# Patient Record
Sex: Male | Born: 1965 | Race: White | Hispanic: No | Marital: Married | Smoking: Never smoker
Health system: Southern US, Community
[De-identification: ages and names within clinical notes are randomized; demographics above are authoritative.]

---

## 2015-02-12 DIAGNOSIS — R6882 Decreased libido: Secondary | ICD-10-CM | POA: Insufficient documentation

## 2015-02-12 DIAGNOSIS — E785 Hyperlipidemia, unspecified: Secondary | ICD-10-CM | POA: Insufficient documentation

## 2015-02-12 DIAGNOSIS — G2581 Restless legs syndrome: Secondary | ICD-10-CM | POA: Insufficient documentation

## 2015-02-12 DIAGNOSIS — I10 Essential (primary) hypertension: Secondary | ICD-10-CM | POA: Insufficient documentation

## 2015-02-12 DIAGNOSIS — F909 Attention-deficit hyperactivity disorder, unspecified type: Secondary | ICD-10-CM | POA: Insufficient documentation

## 2015-02-12 DIAGNOSIS — F419 Anxiety disorder, unspecified: Secondary | ICD-10-CM | POA: Insufficient documentation

## 2015-02-12 DIAGNOSIS — K760 Fatty (change of) liver, not elsewhere classified: Secondary | ICD-10-CM | POA: Insufficient documentation

## 2015-02-16 DIAGNOSIS — Z0000 Encounter for general adult medical examination without abnormal findings: Secondary | ICD-10-CM | POA: Insufficient documentation

## 2015-09-01 NOTE — Progress Notes (Signed)
[facility] [address] [place], [place] [address] Phone: [phone] Subjective:     CC: back pain, groin pain  BJY:NWGNFAOZHY  Michael Groft. is a 50 y.o. male coming in with complaint of back pain and groin pain. Patient is an active 50 year old male. Patient states that he was helping a friend move heavy materials and unfortunately had a sharp pain that occurred in the back. Larey Seat radiation going down the leg. Very similar to an episode where he had an L4 bulging disc when he was 50 years of age. Patient states that this is approximately 3 weeks ago. Seems to be worsening over the course of time. Patient denies any weakness but states that he does have radiation going down the posterior aspect of the leg all the way down to the foot. Patient denies any weakness. States though that he has not worked out for about 3 weeks secondary to the pain. Patient is still able to do daily activities but seems that when he sits for long amount of time or stands for long amount time and seems to be worse. Worse with flexion that it is with extension. States that when he does stretch it seems to be better.     Current Outpatient Prescriptions:  .  fenofibrate 160 MG tablet, TAKE 1 TABLET BY MOUTH DAILY, Disp: , Rfl:  .  FLUoxetine (PROZAC) 10 MG capsule, TAKE 3 CAPSULES EVERY DAY, Disp: , Rfl:  .  lisinopril-hydrochlorothiazide (PRINZIDE,ZESTORETIC) 10-12.5 MG tablet, TAKE 1 TABLET BY MOUTH EVERY DAY, Disp: , Rfl:  .  gabapentin (NEURONTIN) 100 MG capsule, Take 2 capsules (200 mg total) by mouth at bedtime., Disp: 60 capsule, Rfl: 3 .  predniSONE (DELTASONE) 50 MG tablet, Take 1 tablet (50 mg total) by mouth daily., Disp: 5 tablet, Rfl: 0  No past medical history on file.hyperlipidemia,hypertension No past surgical history on file. Social History   Social History  . Marital status: Married    Spouse name: N/A  . Number of children: N/A  . Years of education: N/A    Social History Main Topics  . Smoking status: Never Smoker  . Smokeless tobacco: Never Used  . Alcohol use None  . Drug use: Unknown  . Sexual activity: Not Asked   Other Topics Concern  . None   Social History Narrative  . None   Allergies  Allergen Reactions  . Niacin Other (See Comments)    Sensation   No family history on file.no family history of rheumatological diseases. Patient's mother has had significant osteoarthritic difficulties  Past medical history, social, surgical and family history all reviewed in electronic medical record.  No pertanent information unless stated regarding to the chief complaint.   Review of Systems: No headache, visual changes, nausea, vomiting, diarrhea, constipation, dizziness, abdominal pain, skin rash, fevers, chills, night sweats, weight loss, swollen lymph nodes, body aches, joint swelling, muscle aches, chest pain, shortness of breath, mood changes.   Objective  Blood pressure 124/82, pulse 81, height  (1.88 m), weight 265 lb (120.2 kg), SpO2 96 %.  General: No apparent distress alert and oriented x3 mood and affect normal, dressed appropriately.  HEENT: Pupils equal, extraocular movements intact  Respiratory: Patient's speak in full sentences and does not appear short of breath  Cardiovascular: No lower extremity edema, non tender, no erythema  Skin: Warm dry intact with no signs of infection or rash on extremities or on axial skeleton.  Abdomen: Soft nontender  Neuro:  Cranial nerves II through XII are intact, neurovascularly intact in all extremities with 2+ DTRs and 2+ pulses.  Lymph: No lymphadenopathy of posterior or anterior cervical chain or axillae bilaterally.  Gait normal with good balance and coordination.  MSK:  Non tender with full range of motion and good stability and symmetric strength and tone of shoulders, elbows, wrist,  knee and ankles bilaterally.  Back Exam:  Inspection: Unremarkable  Motion: Flexion 35 deg  with worsening pain mild radiation down the right leg, Extension 15 deg, Side Bending to 45 deg bilaterally,  Rotation to 45 deg bilaterally  SLR laying:mild positive right side XSLR laying: Negative  Palpable tenderness: tender to palpation in the paraspinal musculature of the lumbar spine bilaterally right greater than left. FABER: significant tightness bilaterally Sensory change: Gross sensation intact to all lumbar and sacral dermatomes.  Reflexes: 2+ at both patellar tendons, 2+ at achilles tendons, Babinski's downgoing.  Strength at foot  Plantar-flexion: 5/5 Dorsi-flexion: 5/5 Eversion: 5/5 Inversion: 5/5  Leg strength  Quad: 5/5 Hamstring: 5/5 Hip flexor: 5/5 Hip abductors: 5/5  Gait unremarkable.  Hip has full internal range of motion bilaterally   Impression and Recommendations:     This case required medical decision making of moderate complexity.      Note: This dictation was prepared with Dragon dictation along with smaller phrase technology. Any transcriptional errors that result from this process are unintentional.

## 2015-09-02 ENCOUNTER — Ambulatory Visit (INDEPENDENT_AMBULATORY_CARE_PROVIDER_SITE_OTHER)
Admission: RE | Admit: 2015-09-02 | Discharge: 2015-09-02 | Disposition: A | Discharge status: Home / Self Care | Payer: BLUE CROSS/BLUE SHIELD | Source: Ambulatory Visit | Attending: Family Medicine | Admitting: Family Medicine

## 2015-09-02 ENCOUNTER — Ambulatory Visit (INDEPENDENT_AMBULATORY_CARE_PROVIDER_SITE_OTHER): Payer: BLUE CROSS/BLUE SHIELD | Admitting: Family Medicine

## 2015-09-02 ENCOUNTER — Encounter: Payer: Self-pay | Admitting: Family Medicine

## 2015-09-02 VITALS — BP 124/82 | HR 81 | Ht 74.0 in | Wt 265.0 lb

## 2015-09-02 DIAGNOSIS — M5441 Lumbago with sciatica, right side: Secondary | ICD-10-CM

## 2015-09-02 DIAGNOSIS — M5416 Radiculopathy, lumbar region: Secondary | ICD-10-CM | POA: Diagnosis not present

## 2015-09-02 MED ORDER — PREDNISONE 50 MG PO TABS: 50.0000 mg | ORAL_TABLET | Freq: Every day | ORAL | 0 refills | Status: DC

## 2015-09-02 MED ORDER — GABAPENTIN 100 MG PO CAPS: 200.0000 mg | ORAL_CAPSULE | Freq: Every day | ORAL | 3 refills | Status: AC

## 2015-09-02 NOTE — Assessment & Plan Note (Signed)
Seems to be mild at this time the patient does have a positive straight leg test. I do not find any signs of hernia. Patient does not have any weakness. Patient though is had this going on for almost 3-4 weeks at this time. I do feel that x-rays are warranted at this time. Patient given prednisone and gabapentin that I hope will be beneficial. Home exercises given and we did discuss in greater detail. Patient will come back again in 2 weeks. At that time if improvement we'll consider continuing conservative therapy or formal physical therapy. Worsening or any signs of weakness we will consider further advanced imaging.

## 2015-09-02 NOTE — Patient Instructions (Addendum)
Good to see you Xray downstairs Ice 20 minutes 2 times daily. Usually after activity and before bed. Exercises 3 times a week.  Prednisone daily for 5 days Gabapentin  at night to help with the nerve pain .  OK to bike or elliptical for now but listen to your body  After the prednisone then get  Vitamin D 2000 IU daily  Turmeric  daily  No heavy lifting See em again in 2 weeks

## 2015-09-04 ENCOUNTER — Telehealth: Payer: Self-pay | Admitting: Emergency Medicine

## 2015-09-04 NOTE — Telephone Encounter (Signed)
LVM with wife to call back and discuss results.

## 2015-09-15 NOTE — Progress Notes (Signed)
Michael Fischer D.O. [facility] [address] [place], [name] Phone: [phone] Subjective:     CC: back pain, groin pain Follow-up  BJY:NWGNFAOZHYHPI:Subjective  Michael DeepGeorge Ceniceros Jr. is a 50 y.o. male coming in with complaint of back pain and groin pain. Patient was seen 2 weeks ago and was diagnosed with more of a lumbar radiculopathy. Patient will try conservative therapy including prednisone, gabapentin, home exercises and icing. Patient states Back pain is on prednisone and was significantly better. Patient states though when he was done with the prednisone it started the worse again. The radicular symptoms down the right leg seems to be more intermittent and it was. Patient still able to do daily activities. Still finds it hard to find a comfortable position. Continues to have more of a groin pain on the anterior aspect still as well.   Patient also did have x-rays at last exam on 09/02/2015 that were independently visualized by me and showing no significant bony abnormality. Patient's hips also appear completely unremarkable.     Current Outpatient Prescriptions:  .  fenofibrate 160 MG tablet, TAKE 1 TABLET BY MOUTH DAILY, Disp: , Rfl:  .  FLUoxetine (PROZAC) 10 MG capsule, TAKE 3 CAPSULES EVERY DAY, Disp: , Rfl:  .  gabapentin (NEURONTIN) 100 MG capsule, Take 2 capsules (200 mg total) by mouth at bedtime., Disp: 60 capsule, Rfl: 3 .  lisinopril-hydrochlorothiazide (PRINZIDE,ZESTORETIC) 10-12.5 MG tablet, TAKE 1 TABLET BY MOUTH EVERY DAY, Disp: , Rfl:   No past medical history on file.hyperlipidemia,hypertension No past surgical history on file. Social History   Social History  . Marital status: Married    Spouse name: N/A  . Number of children: N/A  . Years of education: N/A   Social History Main Topics  . Smoking status: Never Smoker  . Smokeless tobacco: Never Used  . Alcohol use None  . Drug use: Unknown  . Sexual activity: Not Asked   Other Topics Concern  . None    Social History Narrative  . None   Allergies  Allergen Reactions  . Niacin Other (See Comments)    Sensation   No family history on file.no family history of rheumatological diseases. Patient's mother has had significant osteoarthritic difficulties  Past medical history, social, surgical and family history all reviewed in electronic medical record.  No pertanent information unless stated regarding to the chief complaint.   Review of Systems: No headache, visual changes, nausea, vomiting, diarrhea, constipation, dizziness, abdominal pain, skin rash, fevers, chills, night sweats, weight loss, swollen lymph nodes, body aches, joint swelling, muscle aches, chest pain, shortness of breath, mood changes.   Objective  Blood pressure 130/80, pulse 83, weight 266 lb (120.7 kg), SpO2 95 %.  General: No apparent distress alert and oriented x3 mood and affect normal, dressed appropriately.  HEENT: Pupils equal, extraocular movements intact  Respiratory: Patient's speak in full sentences and does not appear short of breath  Cardiovascular: No lower extremity edema, non tender, no erythema  Skin: Warm dry intact with no signs of infection or rash on extremities or on axial skeleton.  Abdomen: Soft nontender  Neuro: Cranial nerves II through XII are intact, neurovascularly intact in all extremities with 2+ DTRs and 2+ pulses.  Lymph: No lymphadenopathy of posterior or anterior cervical chain or axillae bilaterally.  Gait normal with good balance and coordination.  MSK:  Non tender with full range of motion and good stability and symmetric strength and tone of shoulders, elbows, wrist,  knee and ankles bilaterally.  Back Exam:  Inspection: Unremarkable  Motion: Flexion 35 deg But no significant pain and only tightness, Extension 15 deg, Side Bending to 45 deg bilaterally,  Rotation to 45 deg bilaterally  SLR laying:Negative today which is an improvement XSLR laying: Negative  Palpable tenderness:  tender to palpation in the paraspinal musculature of the lumbar spine bilaterally right greater than left. Maybe a small amount improvement FABER: significant tightness bilaterally Sensory change: Gross sensation intact to all lumbar and sacral dermatomes.  Reflexes: 2+ at both patellar tendons, 2+ at achilles tendons, Babinski's downgoing.  Strength at foot  Plantar-flexion: 5/5 Dorsi-flexion: 5/5 Eversion: 5/5 Inversion: 5/5  Leg strength  Quad: 5/5 Hamstring: 5/5 Hip flexor: 5/5 Hip abductors: 5/5  Gait unremarkable.  Hip has full internal range of motion bilaterally  Osteopathic findings T3 extended rotated and side bent right L2 flexed rotated and side bent right Sacrum left on left   Impression and Recommendations:     This case required medical decision making of moderate complexity.      Note: This dictation was prepared with Dragon dictation along with smaller phrase technology. Any transcriptional errors that result from this process are unintentional.

## 2015-09-16 ENCOUNTER — Encounter: Payer: Self-pay | Admitting: Family Medicine

## 2015-09-16 ENCOUNTER — Ambulatory Visit (INDEPENDENT_AMBULATORY_CARE_PROVIDER_SITE_OTHER): Payer: BLUE CROSS/BLUE SHIELD | Admitting: Family Medicine

## 2015-09-16 DIAGNOSIS — M9904 Segmental and somatic dysfunction of sacral region: Secondary | ICD-10-CM

## 2015-09-16 DIAGNOSIS — M9902 Segmental and somatic dysfunction of thoracic region: Secondary | ICD-10-CM | POA: Diagnosis not present

## 2015-09-16 DIAGNOSIS — M999 Biomechanical lesion, unspecified: Secondary | ICD-10-CM | POA: Insufficient documentation

## 2015-09-16 DIAGNOSIS — M9903 Segmental and somatic dysfunction of lumbar region: Secondary | ICD-10-CM

## 2015-09-16 DIAGNOSIS — M5416 Radiculopathy, lumbar region: Secondary | ICD-10-CM | POA: Diagnosis not present

## 2015-09-16 NOTE — Patient Instructions (Signed)
Good to see you  Ice is your friend Stay active and increase activity but do stretches afterward.  Vitamin D is important If worsening pain down the leg or weakness please call me and we will move forward with the MRI Otherwise see me again in 3 weeks.

## 2015-09-16 NOTE — Assessment & Plan Note (Signed)
Discussed with patient again at great length. We did discuss the patient starts having constant radiation or any weakness that we need to get advance imaging. Patient will call if this occurs. We discussed continuing the same medications but to take gabapentin on a more regular basis at night. Patient has not been doing that. We also discussed core strengthening exercises more with isometrics. Patient did respond fairly well to osteopathic manipulation and we'll see how he responds. Patient will come back and see me again in 3-4 weeks for further evaluation and treatment. She wants to avoid chronic medications if possible.

## 2015-09-16 NOTE — Assessment & Plan Note (Signed)
Decision today to treat with OMT was based on Physical Exam  After verbal consent patient was treated with HVLA, ME techniques in thoracic lumbar, sacral areas  Patient tolerated the procedure well with improvement in symptoms  Patient given exercises, stretches and lifestyle modifications  See medications in patient instructions if given  Patient will follow up in 3-4 weeks

## 2015-10-07 NOTE — Progress Notes (Signed)
Tawana ScaleZach Smith D.O. [facility] [address] [place], [name] Phone: [phone] Subjective:     CC: back pain, groin pain Follow-up  BJY:NWGNFAOZHYHPI:Subjective  Michael DeepGeorge Silsby Jr. is a 50 y.o. male coming in with complaint of back pain and groin pain. Patient was seen 2 weeks ago and was diagnosed with more of a lumbar radiculopathy. Patient will try conservative therapy including prednisone, gabapentin, home exercises and icing. Patient states Back pain is on prednisone and was significantly better. Patient states though when he was done with the prednisone it started the worse again. The radicular symptoms down the right leg have stopped completely at moment.  Back pain is improved as well.  No weakness, happy with results.  80% better   Patient also did have x-rays at last exam on 09/02/2015 that were independently visualized by me and showing no significant bony abnormality. Patient's hips also appear completely unremarkable.     Current Outpatient Prescriptions:  .  fenofibrate 160 MG tablet, TAKE 1 TABLET BY MOUTH DAILY, Disp: , Rfl:  .  FLUoxetine (PROZAC) 10 MG capsule, TAKE 3 CAPSULES EVERY DAY, Disp: , Rfl:  .  gabapentin (NEURONTIN) 100 MG capsule, Take 2 capsules (200 mg total) by mouth at bedtime., Disp: 60 capsule, Rfl: 3 .  lisinopril-hydrochlorothiazide (PRINZIDE,ZESTORETIC) 10-12.5 MG tablet, TAKE 1 TABLET BY MOUTH EVERY DAY, Disp: , Rfl:   No past medical history on file.hyperlipidemia,hypertension No past surgical history on file. Social History   Social History  . Marital status: Married    Spouse name: N/A  . Number of children: N/A  . Years of education: N/A   Social History Main Topics  . Smoking status: Never Smoker  . Smokeless tobacco: Never Used  . Alcohol use Not on file  . Drug use: Unknown  . Sexual activity: Not on file   Other Topics Concern  . Not on file   Social History Narrative  . No narrative on file   Allergies  Allergen  Reactions  . Niacin Other (See Comments)    Sensation   No family history on file.no family history of rheumatological diseases. Patient's mother has had significant osteoarthritic difficulties  Past medical history, social, surgical and family history all reviewed in electronic medical record.  No pertanent information unless stated regarding to the chief complaint.   Review of Systems: No headache, visual changes, nausea, vomiting, diarrhea, constipation, dizziness, abdominal pain, skin rash, fevers, chills, night sweats, weight loss, swollen lymph nodes, body aches, joint swelling, muscle aches, chest pain, shortness of breath, mood changes.   Objective  There were no vitals taken for this visit.  General: No apparent distress alert and oriented x3 mood and affect normal, dressed appropriately.  HEENT: Pupils equal, extraocular movements intact  Respiratory: Patient's speak in full sentences and does not appear short of breath  Cardiovascular: No lower extremity edema, non tender, no erythema  Skin: Warm dry intact with no signs of infection or rash on extremities or on axial skeleton.  Abdomen: Soft nontender  Neuro: Cranial nerves II through XII are intact, neurovascularly intact in all extremities with 2+ DTRs and 2+ pulses.  Lymph: No lymphadenopathy of posterior or anterior cervical chain or axillae bilaterally.  Gait normal with good balance and coordination.  MSK:  Non tender with full range of motion and good stability and symmetric strength and tone of shoulders, elbows, wrist,  knee and ankles bilaterally.  Back Exam:  Inspection: Unremarkable  Motion: Flexion 40 deg ,  Extension 20 deg, Side Bending to 45 deg bilaterally,  Rotation to 45 deg bilaterally improved ROM  SLR laying:Negative today which is an improvement XSLR laying: Negative  Palpable tenderness: minimal TTP in lumbar region and SI joint.  FABER: significant tightness bilaterally Sensory change: Gross sensation  intact to all lumbar and sacral dermatomes.  Reflexes: 2+ at both patellar tendons, 2+ at achilles tendons, Babinski's downgoing.  Strength at foot  Plantar-flexion: 5/5 Dorsi-flexion: 5/5 Eversion: 5/5 Inversion: 5/5  Leg strength  Quad: 5/5 Hamstring: 5/5 Hip flexor: 5/5 Hip abductors: 5/5  Gait unremarkable.  Hip has full internal range of motion bilaterally  Osteopathic findings T3 extended rotated and side bent right T7 flexed rotated and side bent left  L2 flexed rotated and side bent right Sacrum left on left   Impression and Recommendations:     This case required medical decision making of moderate complexity.      Note: This dictation was prepared with Dragon dictation along with smaller phrase technology. Any transcriptional errors that result from this process are unintentional.

## 2015-10-08 ENCOUNTER — Ambulatory Visit (INDEPENDENT_AMBULATORY_CARE_PROVIDER_SITE_OTHER): Payer: BLUE CROSS/BLUE SHIELD | Admitting: Family Medicine

## 2015-10-08 ENCOUNTER — Encounter: Payer: Self-pay | Admitting: Family Medicine

## 2015-10-08 VITALS — BP 122/84 | HR 78

## 2015-10-08 DIAGNOSIS — M9903 Segmental and somatic dysfunction of lumbar region: Secondary | ICD-10-CM | POA: Diagnosis not present

## 2015-10-08 DIAGNOSIS — M9902 Segmental and somatic dysfunction of thoracic region: Secondary | ICD-10-CM

## 2015-10-08 DIAGNOSIS — M5416 Radiculopathy, lumbar region: Secondary | ICD-10-CM

## 2015-10-08 DIAGNOSIS — M9904 Segmental and somatic dysfunction of sacral region: Secondary | ICD-10-CM | POA: Diagnosis not present

## 2015-10-08 DIAGNOSIS — M999 Biomechanical lesion, unspecified: Secondary | ICD-10-CM

## 2015-10-08 NOTE — Assessment & Plan Note (Signed)
No radicular symptoms at this time. We discussed continuing the same medications, home exercises and icing. Patient has been only using the gabapentin intermittently. Has responded well to osteopathic manipulation. Sinus patient does well he can see me every 2-3 months.

## 2015-10-08 NOTE — Patient Instructions (Signed)
Good to see you  Ice 20 minutes 2 times daily. Usually after activity and before bed. Keep doing what you are doing Biking or elliptical to keep in shape In 2 weeks can try lifting but start 50% and increase 5-10% a week.  See me again in 8 weeks for manipulation.

## 2015-10-08 NOTE — Assessment & Plan Note (Signed)
Decision today to treat with OMT was based on Physical Exam  After verbal consent patient was treated with HVLA, ME techniques in thoracic lumbar, sacral areas  Patient tolerated the procedure well with improvement in symptoms  Patient given exercises, stretches and lifestyle modifications  See medications in patient instructions if given  Patient will follow up in 8-12 weeks

## 2015-12-02 ENCOUNTER — Other Ambulatory Visit: Payer: Self-pay | Admitting: *Deleted

## 2015-12-02 DIAGNOSIS — M5416 Radiculopathy, lumbar region: Secondary | ICD-10-CM

## 2015-12-03 ENCOUNTER — Ambulatory Visit: Payer: BLUE CROSS/BLUE SHIELD | Admitting: Family Medicine

## 2015-12-13 ENCOUNTER — Ambulatory Visit
Admission: RE | Admit: 2015-12-13 | Discharge: 2015-12-13 | Disposition: A | Discharge status: Home / Self Care | Payer: BLUE CROSS/BLUE SHIELD | Source: Ambulatory Visit | Attending: Family Medicine | Admitting: Family Medicine

## 2015-12-13 DIAGNOSIS — M5416 Radiculopathy, lumbar region: Secondary | ICD-10-CM

## 2015-12-14 ENCOUNTER — Telehealth: Payer: Self-pay

## 2015-12-14 NOTE — Telephone Encounter (Signed)
-----   Message from Judi SaaZachary M Smith, DO sent at 12/14/2015  7:39 AM EST ----- When you get a chance call patient and tell them that the do have a protruding disc, could give some of  His symptoms.  We can try an epidural injection at L4-L5 that could help with pain.  If he is agreeable then please order.

## 2015-12-16 ENCOUNTER — Other Ambulatory Visit: Payer: Self-pay | Admitting: *Deleted

## 2015-12-16 DIAGNOSIS — M5416 Radiculopathy, lumbar region: Secondary | ICD-10-CM

## 2015-12-30 ENCOUNTER — Ambulatory Visit
Admission: RE | Admit: 2015-12-30 | Discharge: 2015-12-30 | Disposition: A | Discharge status: Home / Self Care | Payer: BLUE CROSS/BLUE SHIELD | Source: Ambulatory Visit | Attending: Family Medicine | Admitting: Family Medicine

## 2015-12-30 DIAGNOSIS — M5416 Radiculopathy, lumbar region: Secondary | ICD-10-CM

## 2015-12-30 MED ORDER — METHYLPREDNISOLONE ACETATE 40 MG/ML INJ SUSP (RADIOLOG
120.0000 mg | Freq: Once | INTRAMUSCULAR | Status: AC
  Administered 2015-12-30: 120 mg via EPIDURAL

## 2015-12-30 MED ORDER — IOPAMIDOL (ISOVUE-M 200) INJECTION 41%
1.0000 mL | Freq: Once | INTRAMUSCULAR | Status: AC
  Administered 2015-12-30: 1 mL via EPIDURAL

## 2015-12-30 NOTE — Discharge Instructions (Signed)

## 2016-01-11 ENCOUNTER — Other Ambulatory Visit: Payer: Self-pay | Admitting: *Deleted

## 2016-01-11 DIAGNOSIS — M5416 Radiculopathy, lumbar region: Secondary | ICD-10-CM

## 2016-01-26 ENCOUNTER — Ambulatory Visit
Admission: RE | Admit: 2016-01-26 | Discharge: 2016-01-26 | Disposition: A | Discharge status: Home / Self Care | Payer: BLUE CROSS/BLUE SHIELD | Source: Ambulatory Visit | Attending: Family Medicine | Admitting: Family Medicine

## 2016-01-26 DIAGNOSIS — M5416 Radiculopathy, lumbar region: Secondary | ICD-10-CM

## 2016-01-26 MED ORDER — METHYLPREDNISOLONE ACETATE 40 MG/ML INJ SUSP (RADIOLOG
120.0000 mg | Freq: Once | INTRAMUSCULAR | Status: AC
  Administered 2016-01-26: 120 mg via EPIDURAL

## 2016-01-26 MED ORDER — IOPAMIDOL (ISOVUE-M 200) INJECTION 41%
1.0000 mL | Freq: Once | INTRAMUSCULAR | Status: AC
  Administered 2016-01-26: 1 mL via EPIDURAL

## 2016-01-26 NOTE — Discharge Instructions (Signed)

## 2016-02-18 ENCOUNTER — Ambulatory Visit (INDEPENDENT_AMBULATORY_CARE_PROVIDER_SITE_OTHER): Payer: BLUE CROSS/BLUE SHIELD | Admitting: Family Medicine

## 2016-02-18 ENCOUNTER — Encounter: Payer: Self-pay | Admitting: Family Medicine

## 2016-02-18 VITALS — BP 120/88 | HR 80 | Ht 74.0 in | Wt 271.8 lb

## 2016-02-18 DIAGNOSIS — M5416 Radiculopathy, lumbar region: Secondary | ICD-10-CM

## 2016-02-18 NOTE — Assessment & Plan Note (Signed)
Worsening symptoms at this time. Patient does have an MRI showing a L5 nerve root impingement. Discussed with patient at great length. Patient has failed all conservative therapy including osteopathic manipulation, home exercises, formal physical therapy, as well as epidural and nerve root injection. Patient did have some mild relief 4 hours after the L5 nerve root injection. This makes me optimistic the patient would respond well to possible surgical intervention to remove the protruding disc in this area. Patient will be referred to orthopedic specialist for further evaluation and treatment. Patient declined any type of pain medication today.  Spent  25 minutes with patient face-to-face and had greater than 50% of counseling including as described above in assessment and plan.

## 2016-02-18 NOTE — Progress Notes (Signed)
Tawana ScaleZach Darris Staiger D.O. [facility] [address] [name] Phone: [phone] Subjective:     CC: back pain, groin pain Follow-up  BJY:NWGNFAOZHYHPI:Subjective  Michael DeepGeorge Giddens Jr. is a 51 y.o. male coming in with complaint of back pain and groin pain. Patient's has had difficulty with his back over the course of time. 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 Patient's was first sent  for an epidural at L4-L5 12/30/2015. Patient had only mild improvement with this. Patient then was sent for an L5 left-sided selective nerve root block.This was done on 01/26/2016. Patient states he did have several hours where he is feeling approximately 99% better and then unfortunately the pain came back. Patient states in daily activities it becoming more difficult. Even bending over to persist teeth causes a severe sharp pain that seems to radiate down the left leg. Patient denies any weakness still. States that the radicular symptoms are intermittent but seems to be increasing in severity as well as frequency. States that even sometimes putting on his shoes can cause a severe amount of pain.   Patient also did have x-rays at last exam on 09/02/2015 that were independently visualized by me and showing no significant bony abnormality. Patient's hips also appear completely unremarkable.  MRI 12/13/2015 show the patient did have a left subarticular recess protrusion at L4-L5 encroaching on the descending left L5 root.    Current Outpatient Prescriptions:  .  fenofibrate 160 MG tablet, TAKE 1 TABLET BY MOUTH DAILY, Disp: , Rfl:  .  FLUoxetine (PROZAC) 10 MG capsule, TAKE 3 CAPSULES EVERY DAY, Disp: , Rfl:  .  gabapentin (NEURONTIN) 100 MG capsule, Take 2 capsules (200 mg total) by mouth at bedtime., Disp: 60 capsule, Rfl: 3 .  lisinopril-hydrochlorothiazide (PRINZIDE,ZESTORETIC) 10-12.5 MG tablet, TAKE 1 TABLET BY MOUTH EVERY DAY, Disp: , Rfl:   No past  medical history on file.hyperlipidemia,hypertension No past surgical history on file. Social History   Social History  . Marital status: Married    Spouse name: N/A  . Number of children: N/A  . Years of education: N/A   Social History Main Topics  . Smoking status: Never Smoker  . Smokeless tobacco: Never Used  . Alcohol use Not on file  . Drug use: Unknown  . Sexual activity: Not on file   Other Topics Concern  . Not on file   Social History Narrative  . No narrative on file   Allergies  Allergen Reactions  . Niacin Other (See Comments)    Sensation   No family history on file.no family history of rheumatological diseases. Patient's mother has had significant osteoarthritic difficulties  Past medical history, social, surgical and family history all reviewed in electronic medical record.  No pertanent information unless stated regarding to the chief complaint.   Review of Systems: No headache, visual changes, nausea, vomiting, diarrhea, constipation, dizziness, abdominal pain, skin rash, fevers, chills, night sweats, weight loss, swollen lymph nodes, body aches, joint swelling, muscle aches, chest pain, shortness of breath, mood changes.    Objective  There were no vitals taken for this visit.  Systems examined below as of 02/18/16 General: NAD A&O x3 mood, affect normal  HEENT: Pupils equal, extraocular movements intact no nystagmus Respiratory: not short of breath at rest or with speaking Cardiovascular: No lower extremity edema, non tender Skin: Warm dry intact with no signs of infection or rash on extremities or on  axial skeleton. Abdomen: Soft nontender, no masses Neuro: Cranial nerves  intact, neurovascularly intact in all extremities with 2+ DTRs and 2+ pulses. Lymph: No lymphadenopathy appreciated today  Gait normal with good balance and coordination.  MSK: Non tender with full range of motion and good stability and symmetric strength and tone of shoulders,  elbows, wrist,  knee hips and ankles bilaterally.   Back Exam:  Inspection: Unremarkable  Motion: Flexion 30 deg With worsening pain. , Extension 20 deg, Side Bending to 35 deg bilaterally,  Rotation to 45 deg bilaterally improved ROM  SLR laying:Negative today which is an improvement XSLR laying: Negative  Palpable tenderness: Increasing tenderness to palpation in the paraspinal musculature of the lumbar spine severe L5-S1 left greater than right FABER: significant tightness bilaterally left greater than right Sensory change: Gross sensation intact to all lumbar and sacral dermatomes.  Reflexes: 2+ at both patellar tendons, 2+ at achilles tendons, Babinski's downgoing.  Strength at foot  Plantar-flexion: 5/5 Dorsi-flexion: 5/5 Eversion: 5/5 Inversion: 5/5  Leg strength  Quad: 5/5 Hamstring: 5/5 Hip flexor: 5/5 Hip abductors: 4/5 but symmetric. Gait unremarkable.     Impression and Recommendations:     This case required medical decision making of moderate complexity.      Note: This dictation was prepared with Dragon dictation along with smaller phrase technology. Any transcriptional errors that result from this process are unintentional.

## 2016-02-18 NOTE — Patient Instructions (Signed)
[facility]  [phone] [address].  They should be calling you soon.  Keep up with the exercises Call me if you have questions  I am here if you need me.

## 2016-02-23 ENCOUNTER — Other Ambulatory Visit: Payer: Self-pay | Admitting: *Deleted

## 2016-02-23 ENCOUNTER — Telehealth: Payer: Self-pay | Admitting: Family Medicine

## 2016-02-23 DIAGNOSIS — M5416 Radiculopathy, lumbar region: Secondary | ICD-10-CM

## 2016-02-23 NOTE — Telephone Encounter (Signed)
[facility]

## 2016-02-23 NOTE — Telephone Encounter (Signed)
Referral entered & sent to [facility].

## 2017-05-05 ENCOUNTER — Other Ambulatory Visit: Payer: Self-pay | Admitting: *Deleted

## 2017-05-05 DIAGNOSIS — M5416 Radiculopathy, lumbar region: Secondary | ICD-10-CM

## 2017-06-23 ENCOUNTER — Other Ambulatory Visit: Payer: Self-pay | Admitting: Neurosurgery

## 2017-06-23 DIAGNOSIS — M5440 Lumbago with sciatica, unspecified side: Secondary | ICD-10-CM

## 2017-06-26 ENCOUNTER — Ambulatory Visit
Admission: RE | Admit: 2017-06-26 | Discharge: 2017-06-26 | Disposition: A | Discharge status: Home / Self Care | Payer: BLUE CROSS/BLUE SHIELD | Source: Ambulatory Visit | Attending: Neurosurgery | Admitting: Neurosurgery

## 2017-06-26 DIAGNOSIS — M5440 Lumbago with sciatica, unspecified side: Secondary | ICD-10-CM

## 2017-08-08 IMAGING — DX DG LUMBAR SPINE COMPLETE 4+V
5 series · 5 of 5 positions shown · non-contrast
Comparison: None.

CLINICAL DATA: Chronic progressive low back pain.  Right leg pain.

EXAM:
LUMBAR SPINE - COMPLETE 4+ VIEW

[l-spine ap]
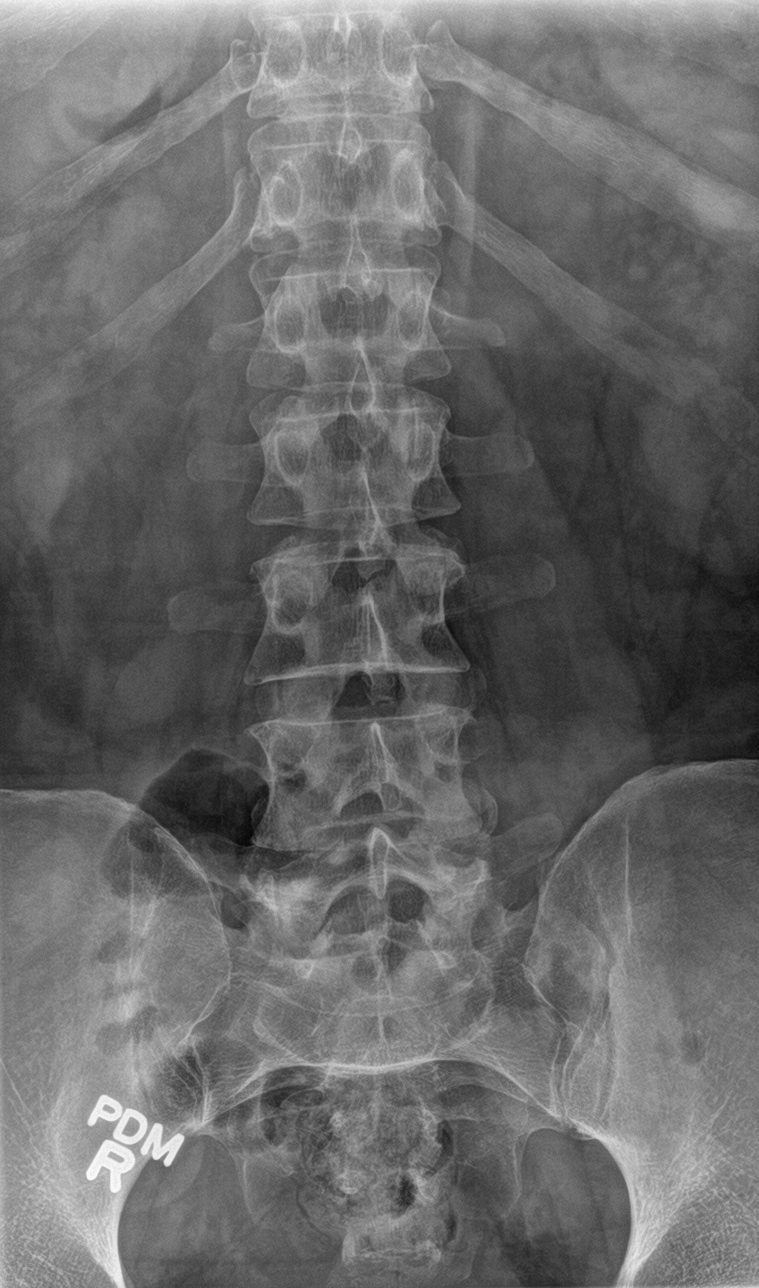

[l-spine obl (1 of 2)]
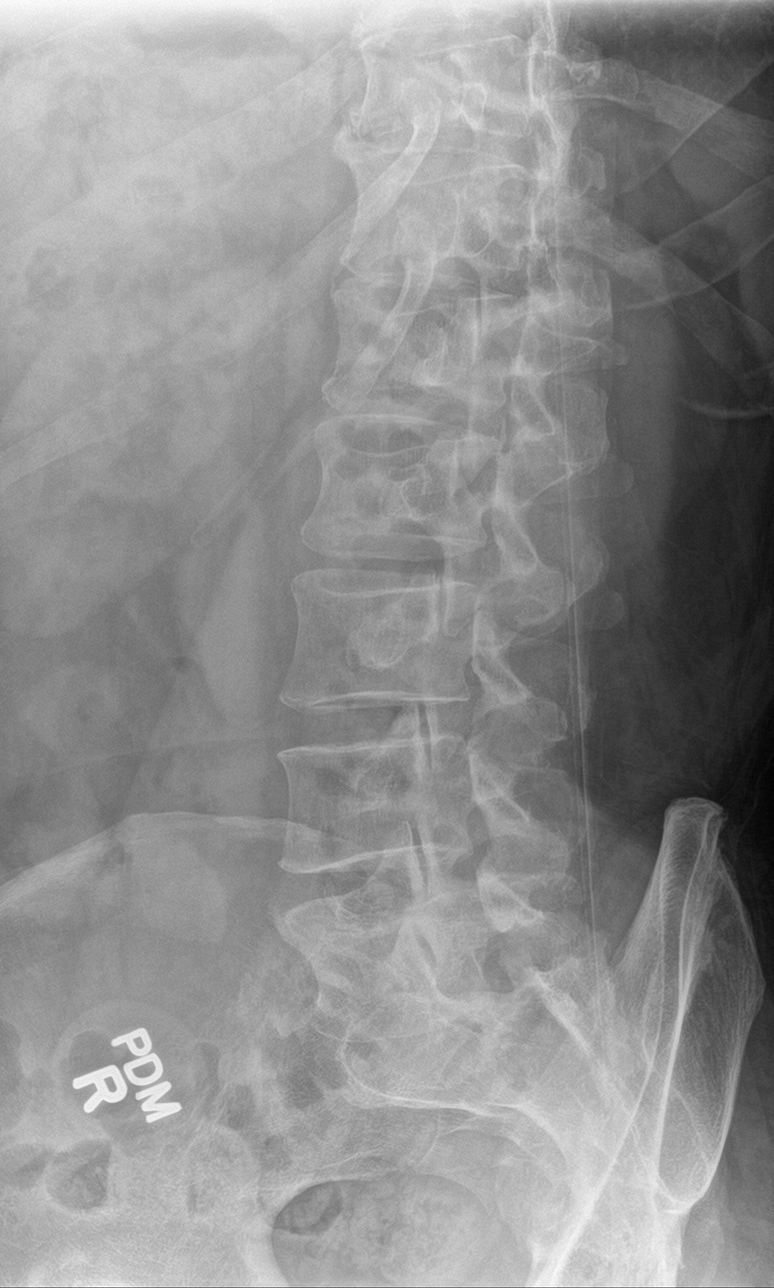

[l-spine obl (2 of 2)]
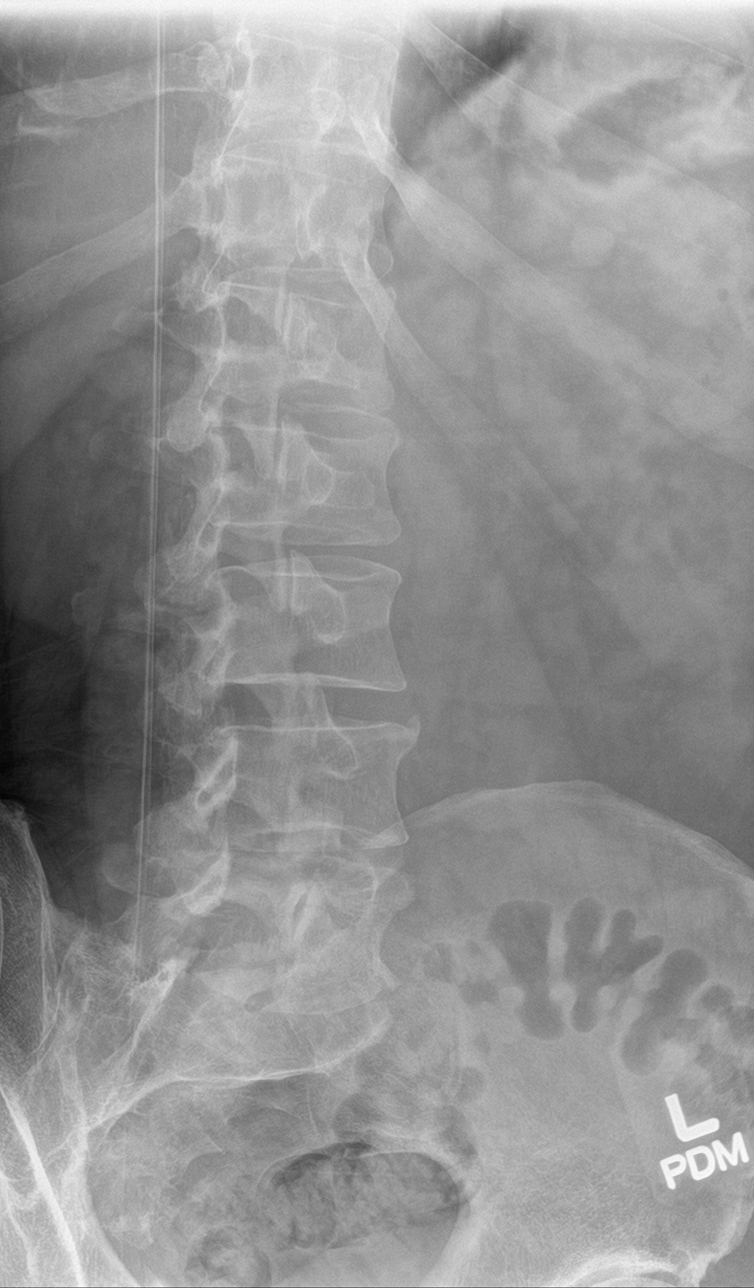

[l-spine lat]
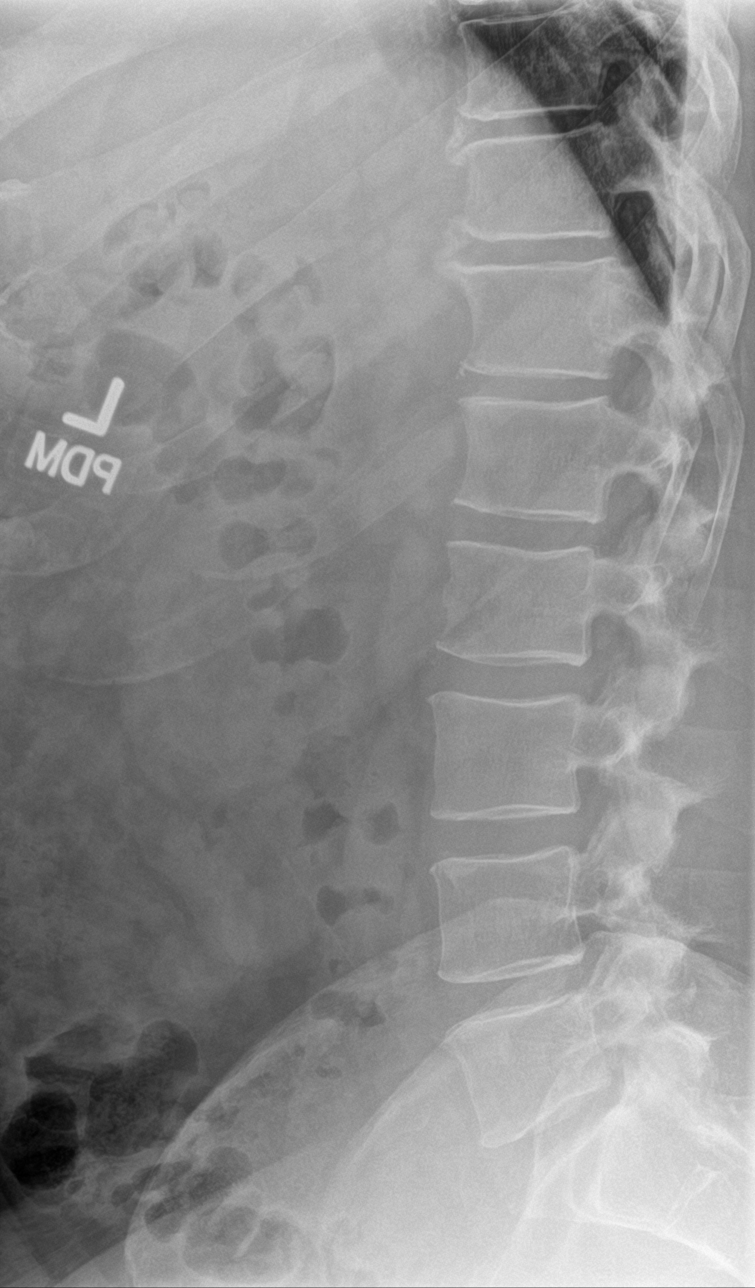

[l-spine spot]
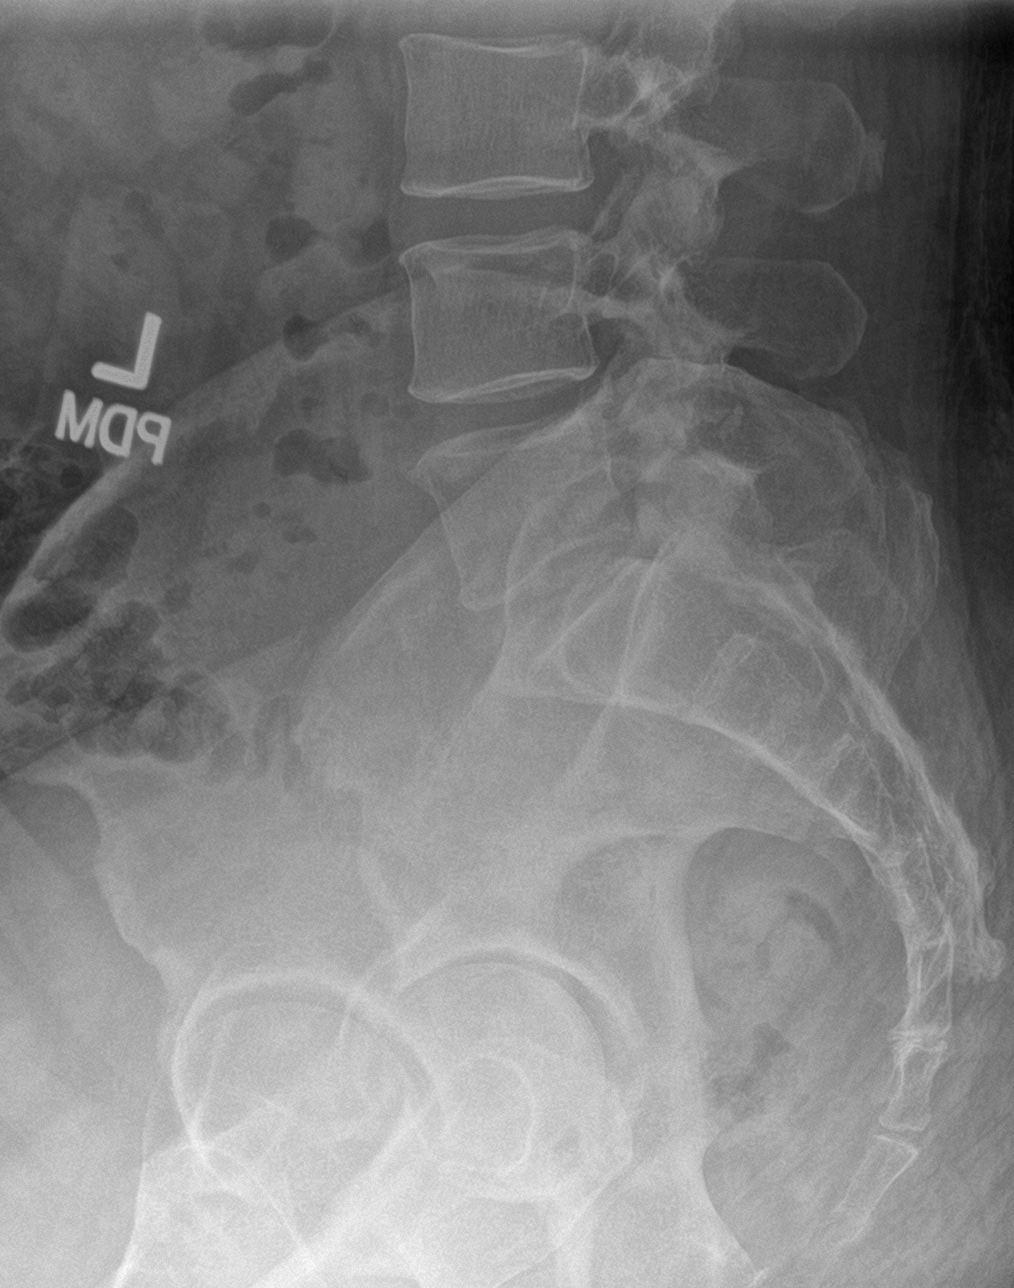

[5 of 5 positions shown; findings below may reference images not displayed]

FINDINGS: There is no evidence of lumbar spine fracture. Alignment is normal.
Intervertebral disc spaces are maintained.
IMPRESSION: Negative.

## 2021-02-09 ENCOUNTER — Other Ambulatory Visit: Payer: Self-pay | Admitting: Neurosurgery

## 2021-02-09 DIAGNOSIS — M5440 Lumbago with sciatica, unspecified side: Secondary | ICD-10-CM

## 2021-02-27 ENCOUNTER — Ambulatory Visit
Admission: RE | Admit: 2021-02-27 | Discharge: 2021-02-27 | Disposition: A | Discharge status: Home / Self Care | Payer: BLUE CROSS/BLUE SHIELD | Source: Ambulatory Visit | Attending: Neurosurgery | Admitting: Neurosurgery

## 2021-02-27 ENCOUNTER — Other Ambulatory Visit: Payer: Self-pay

## 2021-02-27 DIAGNOSIS — M5440 Lumbago with sciatica, unspecified side: Secondary | ICD-10-CM

## 2021-02-27 MED ORDER — GADOBENATE DIMEGLUMINE 529 MG/ML IV SOLN
20.0000 mL | Freq: Once | INTRAVENOUS | Status: AC | PRN
  Administered 2021-02-27: 20 mL via INTRAVENOUS
